# Patient Record
Sex: Female | Born: 1965 | Race: Black or African American | Hispanic: No | Marital: Married | Smoking: Never smoker
Health system: Southern US, Community
[De-identification: ages and names within clinical notes are randomized; demographics above are authoritative.]

## PROBLEM LIST (undated history)

## (undated) DIAGNOSIS — E7800 Pure hypercholesterolemia, unspecified: Secondary | ICD-10-CM

## (undated) DIAGNOSIS — I639 Cerebral infarction, unspecified: Secondary | ICD-10-CM

## (undated) HISTORY — PX: ABDOMINAL HYSTERECTOMY: SHX81

## (undated) HISTORY — PX: FRACTURE SURGERY: SHX138

## (undated) HISTORY — PX: BREAST SURGERY: SHX581

## (undated) HISTORY — PX: CHOLECYSTECTOMY: SHX55

---

## 2017-08-29 ENCOUNTER — Emergency Department (HOSPITAL_COMMUNITY)
Admission: EM | Admit: 2017-08-29 | Discharge: 2017-08-29 | Disposition: A | Discharge status: Home / Self Care | Payer: Self-pay | Attending: Emergency Medicine | Admitting: Emergency Medicine

## 2017-08-29 ENCOUNTER — Other Ambulatory Visit: Payer: Self-pay

## 2017-08-29 ENCOUNTER — Encounter (HOSPITAL_COMMUNITY): Payer: Self-pay | Admitting: *Deleted

## 2017-08-29 ENCOUNTER — Emergency Department (HOSPITAL_COMMUNITY): Payer: Self-pay

## 2017-08-29 DIAGNOSIS — R109 Unspecified abdominal pain: Secondary | ICD-10-CM | POA: Insufficient documentation

## 2017-08-29 DIAGNOSIS — N8302 Follicular cyst of left ovary: Secondary | ICD-10-CM | POA: Insufficient documentation

## 2017-08-29 DIAGNOSIS — N83209 Unspecified ovarian cyst, unspecified side: Secondary | ICD-10-CM

## 2017-08-29 DIAGNOSIS — Z8673 Personal history of transient ischemic attack (TIA), and cerebral infarction without residual deficits: Secondary | ICD-10-CM | POA: Insufficient documentation

## 2017-08-29 DIAGNOSIS — R3121 Asymptomatic microscopic hematuria: Secondary | ICD-10-CM | POA: Insufficient documentation

## 2017-08-29 DIAGNOSIS — Z79899 Other long term (current) drug therapy: Secondary | ICD-10-CM | POA: Insufficient documentation

## 2017-08-29 HISTORY — DX: Cerebral infarction, unspecified: I63.9

## 2017-08-29 HISTORY — DX: Pure hypercholesterolemia, unspecified: E78.00

## 2017-08-29 LAB — BASIC METABOLIC PANEL
Anion gap: 11 (ref 5–15)
BUN: 12 mg/dL (ref 6–20)
CALCIUM: 9.3 mg/dL (ref 8.9–10.3)
CHLORIDE: 104 mmol/L (ref 101–111)
CO2: 24 mmol/L (ref 22–32)
CREATININE: 0.84 mg/dL (ref 0.44–1.00)
GFR calc non Af Amer: >60 mL/min (ref >60)
GLUCOSE: 128 mg/dL — AB (ref 65–99)
Potassium: 3.9 mmol/L (ref 3.5–5.1)
Sodium: 139 mmol/L (ref 135–145)

## 2017-08-29 LAB — URINALYSIS, ROUTINE W REFLEX MICROSCOPIC
BILIRUBIN URINE: NEGATIVE
Bacteria, UA: NONE SEEN
GLUCOSE, UA: NEGATIVE mg/dL
Ketones, ur: 20 mg/dL — AB
NITRITE: NEGATIVE
PH: 5 (ref 5.0–8.0)
Protein, ur: NEGATIVE mg/dL
Specific Gravity, Urine: 1.03 (ref 1.005–1.030)

## 2017-08-29 LAB — CBC
HCT: 41.8 % (ref 36.0–46.0)
Hemoglobin: 13.6 g/dL (ref 12.0–15.0)
MCH: 27.4 pg (ref 26.0–34.0)
MCHC: 32.5 g/dL (ref 30.0–36.0)
MCV: 84.3 fL (ref 78.0–100.0)
Platelets: 284 10*3/uL (ref 150–400)
RBC: 4.96 MIL/uL (ref 3.87–5.11)
RDW: 13.2 % (ref 11.5–15.5)
WBC: 10.8 10*3/uL — ABNORMAL HIGH (ref 4.0–10.5)

## 2017-08-29 LAB — I-STAT BETA HCG BLOOD, ED (MC, WL, AP ONLY)

## 2017-08-29 MED ORDER — MORPHINE SULFATE (PF) 4 MG/ML IV SOLN
4.0000 mg | Freq: Once | INTRAVENOUS | Status: AC
  Administered 2017-08-29: 4 mg via INTRAVENOUS
  Filled 2017-08-29: qty 1

## 2017-08-29 MED ORDER — ONDANSETRON HCL 4 MG/2ML IJ SOLN
4.0000 mg | Freq: Once | INTRAMUSCULAR | Status: AC
  Administered 2017-08-29: 4 mg via INTRAVENOUS
  Filled 2017-08-29: qty 2

## 2017-08-29 MED ORDER — ONDANSETRON 4 MG PO TBDP: 4.0000 mg | ORAL_TABLET | Freq: Three times a day (TID) | ORAL | 0 refills | Status: AC | PRN

## 2017-08-29 MED ORDER — ONDANSETRON 4 MG PO TBDP
4.0000 mg | ORAL_TABLET | Freq: Once | ORAL | Status: AC | PRN
  Administered 2017-08-29: 4 mg via ORAL
  Filled 2017-08-29: qty 1

## 2017-08-29 NOTE — Discharge Instructions (Signed)
You were seen in the ER for left lower back pain.  Your CT scan showed 2 hemorrhagic cysts on your left side with some blood around them otherwise your CT looked normal.  Your urinalysis did have a small amount of blood but no signs of infection.. As we discussed it is possible that you may have passed a kidney stone although this is less likely.  Take  600 mg of ibuprofen and/or 500 to 1000 mg of acetaminophen every 6-8 hours for the next 2 to 3 days to help with the pain.  Zofran for nausea as needed.  Return to the ER for worsening pain, vomiting, urinary symptoms, bloody diarrhea, fevers, chills, groin numbness, weakness or numbness to her extremities.

## 2017-08-29 NOTE — ED Triage Notes (Signed)
Pt was in class around 9am this morning when she felt like she faint and got sweaty.  Pt then drove herself home and got her daughter and came to the ED.  Pt states she feels worse now.  No visible signs of sweating noted on pt.  Pt reports left lower side pain that radiates to her back.  Pt reports sitting up makes the pain worse.  Pt reports nausea but no vomiting. Pt a/o x 4 and normally ambulatory.

## 2017-08-29 NOTE — ED Provider Notes (Signed)
[facility]-EMERGENCY DEPT Provider Note   CSN: [id number] Arrival date & time: 08/29/17  1205  History   Chief Complaint Chief Complaint  Patient presents with  . Flank Pain   HPI Melissa Nestleamela Cline is a 52 y.o. female here for evaluation of left low back pain.  Pain is now an 8/10 after taking Tylenol 1 hour ago, at onset of pain was 10/10.  Pain was sudden.  Nonradiating.  Described as sharp and dull.  Associated with sweats and nausea.  Aggravated by sitting up.  She denies any fevers, vomiting, frontal abdominal pain, dysuria, hematuria, urgency, frequency, changes to her bowel movements, vaginal discharge, saddle anesthesia, numbness weakness to extremities. No trauma, falls. No previous h/o back injuries or surgeries. H/o hysterectomy. Not sexually active.   HPI  Past Medical History:  Diagnosis Date  . Hypercholesteremia   . Stroke [facility](HCC)     There are no active problems to display for this patient.   Past Surgical History:  Procedure Laterality Date  . ABDOMINAL HYSTERECTOMY    . BREAST SURGERY    . CHOLECYSTECTOMY    . FRACTURE SURGERY Right    ankle     OB History   None      Home Medications    Prior to Admission medications   Medication Sig Start Date End Date Taking? Authorizing Provider  acetaminophen (TYLENOL) 500 MG tablet Take 1,000 mg by mouth daily as needed (pain).   Yes [provider]  atorvastatin (LIPITOR) 80 MG tablet Take 80 mg by mouth at bedtime. 02/27/17 02/27/18 Yes [provider]  baclofen (LIORESAL) 10 MG tablet Take 10 mg by mouth 2 (two) times daily. 06/07/17  Yes [provider]  Fremanezumab-vfrm (AJOVY) 225 MG/1.5ML SOSY Inject 225 mg into the skin every 30 (thirty) days. 02/08/17 02/08/18 Yes [provider]  gabapentin (NEURONTIN) 300 MG capsule Take 900 mg by mouth at bedtime. 06/07/17  Yes [provider]  meloxicam (MOBIC) 15 MG tablet Take 15 mg by mouth daily.  04/10/17  Yes [provider]  norethindrone (AYGESTIN) 5 MG tablet Take 5 mg by mouth daily. 11/23/15  Yes [provider]  nortriptyline (PAMELOR) 25 MG capsule Take 25 mg by mouth daily. 06/06/17 06/06/18 Yes [provider]  ondansetron (ZOFRAN) 4 MG tablet Take 4 mg by mouth every 4 (four) hours as needed for nausea. 07/08/15  Yes [provider]  riboflavin (VITAMIN B-2) 100 MG TABS tablet Take 100 mg by mouth 2 (two) times daily.   Yes [provider]  Vitamin D, Ergocalciferol, (DRISDOL) 50000 units CAPS capsule TAKE ONE CAPSULE (50,000 UNITS TOTAL) BY MOUTH ONCE A WEEK. 03/15/17  Yes [provider]  ondansetron (ZOFRAN ODT) 4 MG disintegrating tablet Take 1 tablet (4 mg total) by mouth every 8 (eight) hours as needed for nausea or vomiting. 08/29/17   Liberty HandyGibbons, Quinnetta Roepke J, PA-C    Family History No family history on file.  Social History Social History   Tobacco Use  . Smoking status: Never Smoker  . Smokeless tobacco: Never Used  Substance Use Topics  . Alcohol use: Not Currently  . Drug use: Never     Allergies   Lexapro  [escitalopram]; Flagyl [metronidazole]; and Nsaids   Review of Systems Review of Systems  Constitutional: Positive for diaphoresis.  Gastrointestinal: Positive for nausea.  Musculoskeletal: Positive for back pain.  All other systems reviewed and are negative.    Physical Exam Updated Vital Signs BP  140/67   Pulse 85   Resp 16   Ht 5\' 4"  (1.626 m)   Wt 98.4 kg (217 lb)   SpO2 100%   BMI 37.25 kg/m   Physical Exam  Constitutional: She is oriented to person, place, and time. She appears well-developed and well-nourished. No distress.  Non toxic.  Skin is dry.  HENT:  Head: Normocephalic and atraumatic.  Nose: Nose normal.  Moist mucous membranes   Eyes: Pupils are equal, round, and reactive to light. Conjunctivae and EOM are normal.  Neck: Normal range of motion.  Cardiovascular: Normal  rate, regular rhythm and intact distal pulses.  2+ DP and radial pulses bilaterally. No LE edema.   Pulmonary/Chest: Effort normal and breath sounds normal.  Abdominal: Soft. Bowel sounds are normal. There is tenderness.   moderate suprapubic and left CVA tenderness.  No G/R/R. Negative Murphy's and McBurney's.  Soft.  Musculoskeletal: Normal range of motion.  Neurological: She is alert and oriented to person, place, and time.  Skin: Skin is warm and dry. Capillary refill takes less than 2 seconds.  Psychiatric: She has a normal mood and affect. Her behavior is normal.  Nursing note and vitals reviewed.    ED Treatments / Results  Labs (all labs ordered are listed, but only abnormal results are displayed) Labs Reviewed  URINALYSIS, ROUTINE W REFLEX MICROSCOPIC - Abnormal; Notable for the following components:      Result Value   APPearance TURBID (*)    Hgb urine dipstick MODERATE (*)    Ketones, ur 20 (*)    Leukocytes, UA TRACE (*)    All other components within normal limits  BASIC METABOLIC PANEL - Abnormal; Notable for the following components:   Glucose, Bld 128 (*)    All other components within normal limits  CBC - Abnormal; Notable for the following components:   WBC 10.8 (*)    All other components within normal limits  URINE CULTURE  I-STAT BETA HCG BLOOD, ED ([facility], [facility], [facility] ONLY)    EKG EKG Interpretation  Date/Time:  Wednesday August 29 2017 12:27:31 EDT Ventricular Rate:  73 PR Interval:    QRS Duration: 78 QT Interval:  411 QTC Calculation: 453 R Axis:   54 Text Interpretation:  Sinus rhythm no acute ST/T changes No old tracing to compare Confirmed by Pricilla Loveless [id number]) on 08/29/2017 1:12:33 PM   Radiology Ct Renal Stone Study  Result Date: 08/29/2017 CLINICAL DATA:  Left flank pain.  Hematuria. EXAM: CT ABDOMEN AND PELVIS WITHOUT CONTRAST TECHNIQUE: Multidetector CT imaging of the abdomen and pelvis was performed following the standard protocol without  IV contrast. COMPARISON:  None. FINDINGS: Lower chest: No acute abnormality. Mild subsegmental atelectasis at the left lung base. Hepatobiliary: No focal liver abnormality is seen. Status post cholecystectomy. No biliary dilatation. Pancreas: Unremarkable. No pancreatic ductal dilatation or surrounding inflammatory changes. Spleen: Normal in size without focal abnormality. Adrenals/Urinary Tract: Adrenal glands are unremarkable. Kidneys are normal, without renal calculi, focal lesion, or hydronephrosis. Bladder is unremarkable. Stomach/Bowel: Stomach is within normal limits. Appendix appears normal. No evidence of bowel wall thickening, distention, or inflammatory changes. Vascular/Lymphatic: No significant vascular findings are present. No enlarged abdominal or pelvic lymph nodes. Reproductive: Two cystic lesions within the left ovary measuring up to 4.6 cm. The smaller lesion is slightly irregular in appearance. The right ovary is unremarkable. Status post hysterectomy. Other: Small amount of intermediate density fluid in the pelvis. No pneumoperitoneum. Musculoskeletal: No acute or significant osseous findings. IMPRESSION:  1. Two cystic lesions within the left ovary measuring up to 4.6 cm. The smaller lesion is slightly irregular in appearance, and given the small amount of intermediate density fluid in the pelvis, findings likely reflect a ruptured hemorrhagic ovarian cyst. Follow-up pelvic ultrasound in 6-12 weeks is recommended to evaluate for resolution. This recommendation follows ACR consensus guidelines: White Paper of the ACR Incidental Findings Committee II on Adnexal Findings. J Am Coll Radiol [phone]. Electronically Signed   By: Obie Dredge M.D.   On: 08/29/2017 14:30    Procedures Procedures (including critical care time)  Medications Ordered in ED Medications  ondansetron (ZOFRAN-ODT) disintegrating tablet 4 mg (4 mg Oral Given 08/29/17 1236)  morphine 4 MG/ML injection 4 mg (4 mg  Intravenous Given 08/29/17 1343)  ondansetron (ZOFRAN) injection 4 mg (4 mg Intravenous Given 08/29/17 1343)     Initial Impression / Assessment and Plan / ED Course  I have reviewed the triage vital signs and the nursing notes.  Pertinent labs & imaging results that were available during my care of the patient were reviewed by me and considered in my medical decision making (see chart for details).  Clinical Course as of Aug 30 1522  Wed Aug 29, 2017  1503 IMPRESSION: 1. Two cystic lesions within the left ovary measuring up to 4.6 cm. The smaller lesion is slightly irregular in appearance, and given the small amount of intermediate density fluid in the pelvis, findings likely reflect a ruptured hemorrhagic ovarian cyst. Follow-up pelvic ultrasound in 6-12 weeks is recommended to evaluate for resolution. This recommendation follows ACR consensus guidelines: White Paper of the ACR Incidental Findings Committee II on Adnexal Findings. J Am Coll Radiol [phone].    CT RENAL STONE STUDY [CG]  1512 Reevaluated patient.  Discussed CT results.  Pain has improved.  Ready for discharge.   [CG]    Clinical Course User Index [CG] Liberty Handy, PA-C   Differential diagnosis includes UTI/pyelonephritis, kidney or ureteral stone.  Also considering MSK injury although she denies recent strenuous activity or trauma.  Less likely PID or torsion as reported pain is left lower back.   On exam she is well-appearing.  Suprapubic and left CVA tenderness.  No peritonitis.  No reproducible TL midline or paraspinal tenderness.  Negative SLR bilaterally.  Lower extremities are neurovascularly intact.  She has no signs or symptoms of cauda equina.  I do not think vaginal exam indicated as she is s/p hysterectomy, not sexually active and denies urinary or vaginal symptoms. No LLQ or RLQ tenderness.   We will obtain screening labs, urinalysis.  Anticipate CT renal study.  Final Clinical  Impressions(s) / ED Diagnoses   1520: CT shows two hemorrhagic cysts on L otherwise unremarkable. UA with moderate hgb, mild ketonuria, trace leukocytes but no bacteria and 11-20 squamous epi otherwise no convincing signs of UTI. She has not noticed gross vaginal bleed or hematuria. She may have passed kidney stone, but less likely. Will dc with zofran, NSAIDs for suspected hemorrhagic cyst associated pain.   Old records, if available, reviewed by me. Imaging and labs viewed and interpreted by me and used in the medical decision making (formal interpretation from radiologist). Discharge home in stable condition, return precautions discussed. Patient, family agreeable with plan for discharge home.  Final diagnoses:  Hemorrhagic ovarian cyst  Asymptomatic microscopic hematuria  Left flank pain    ED Discharge Orders        Ordered    ondansetron (ZOFRAN ODT) 4  MG disintegrating tablet  Every 8 hours PRN     08/29/17 1513       Liberty Handy, PA-C 08/29/17 1524    Pricilla Loveless, MD 08/30/17 [phone]

## 2017-08-30 LAB — URINE CULTURE: CULTURE: NO GROWTH

## 2019-09-06 IMAGING — CT CT RENAL STONE PROTOCOL
2 of 4 series · 17 of 46 positions shown, 19 images · non-contrast
Comparison: None.

CLINICAL DATA: Left flank pain.  Hematuria.

EXAM:
CT ABDOMEN AND PELVIS WITHOUT CONTRAST
TECHNIQUE: Multidetector CT imaging of the abdomen and pelvis was performed
following the standard protocol without IV contrast.

[Series 2: axial st · axial · 0.71mm/px · z∈[+1348,+1743]mm · 14 of 89 slices shown, 16 images]
[im 5/89  soft-tissue]
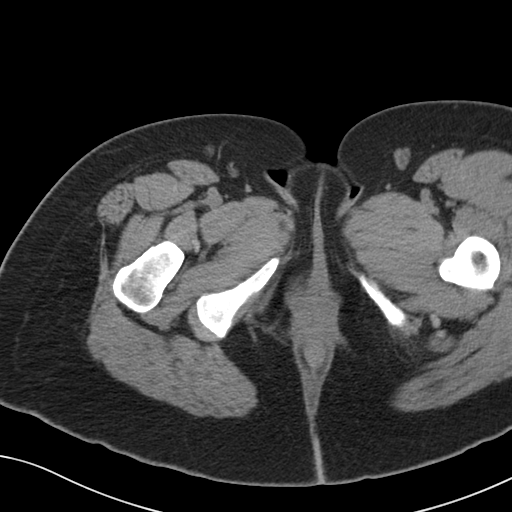
[im 5/89  bone]
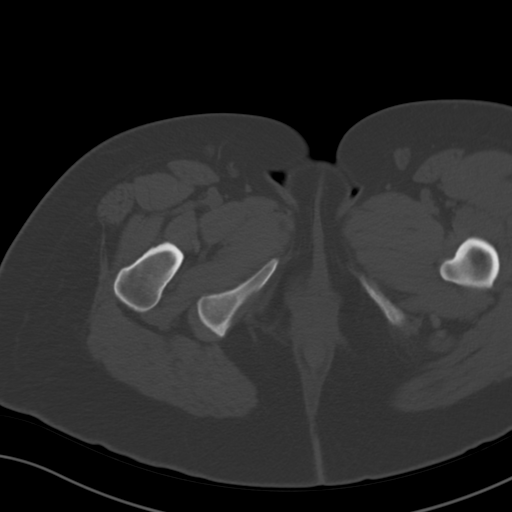
[im 10/89  soft-tissue]
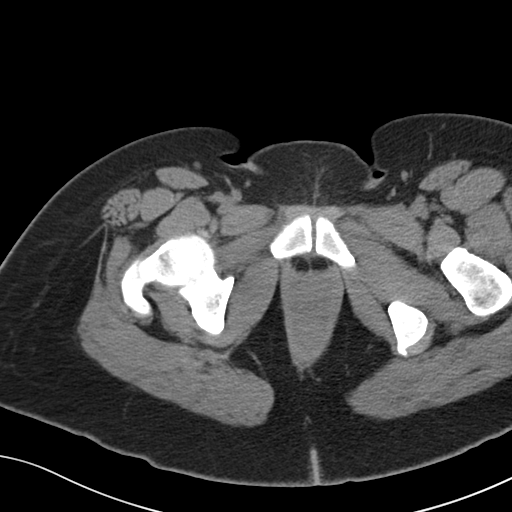
[im 19/89  soft-tissue]
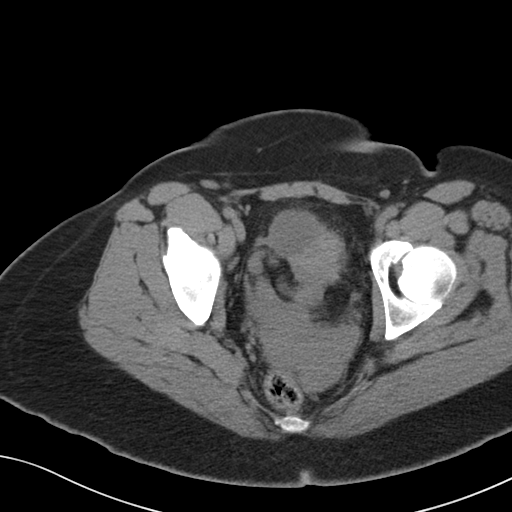
[im 24/89  soft-tissue]
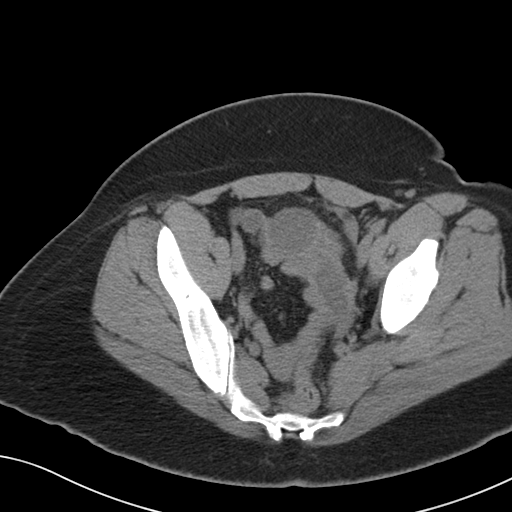
[im 28/89  soft-tissue]
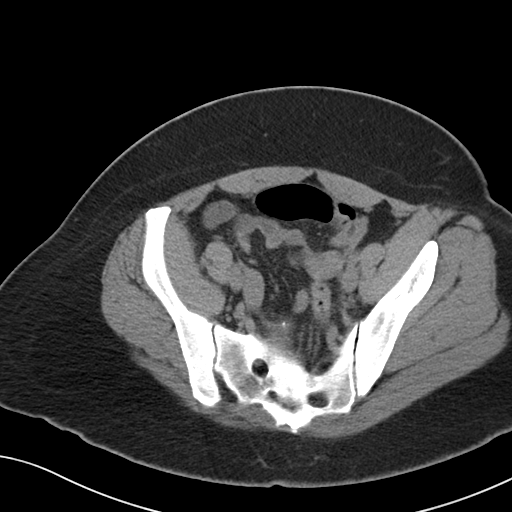
[im 38/89  soft-tissue]
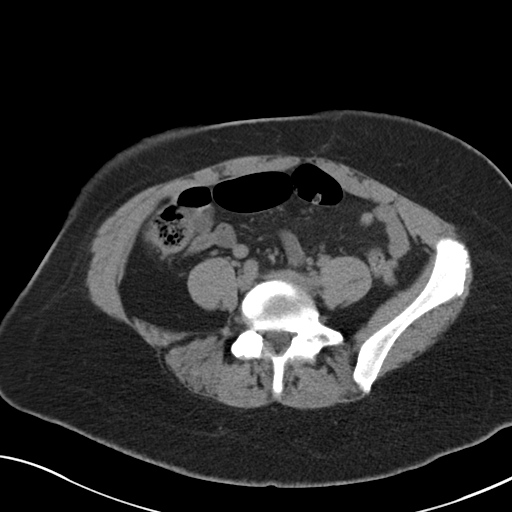
[im 42/89  soft-tissue]
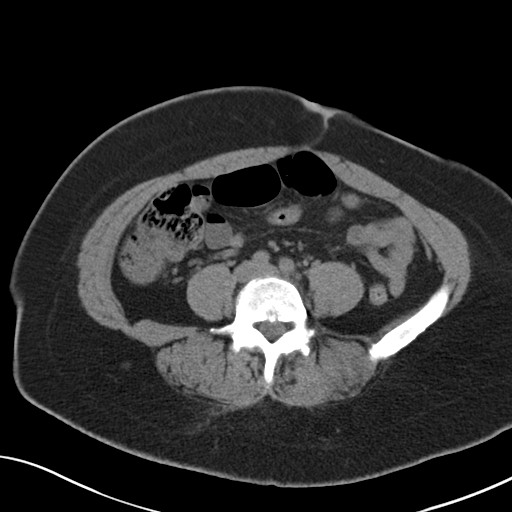
[im 47/89  soft-tissue]
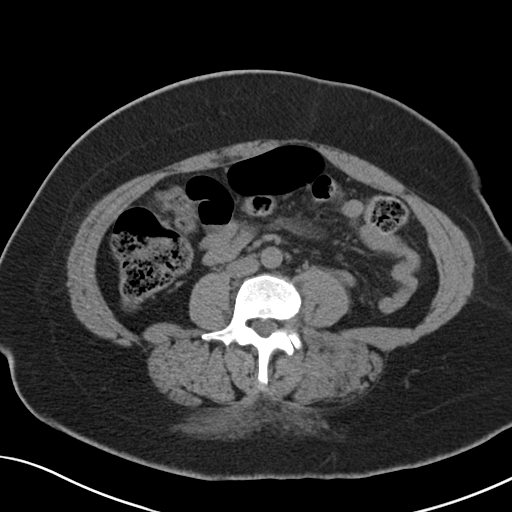
[im 51/89  soft-tissue]
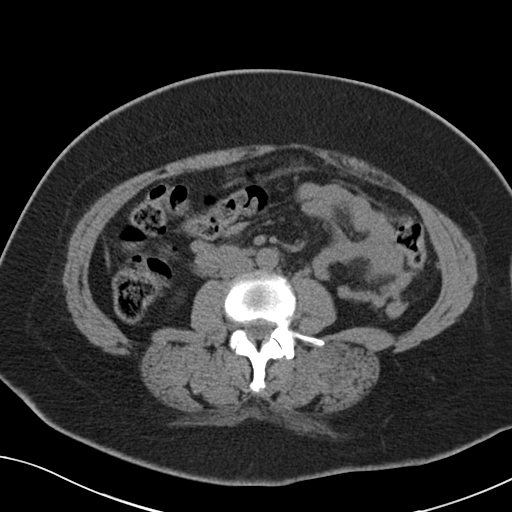
[im 51/89  bone]
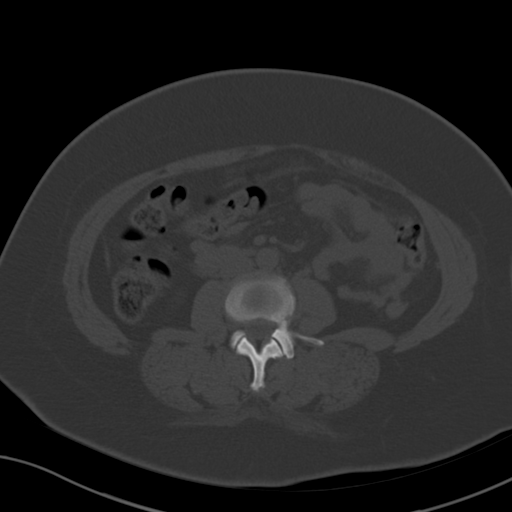
[im 61/89  soft-tissue]
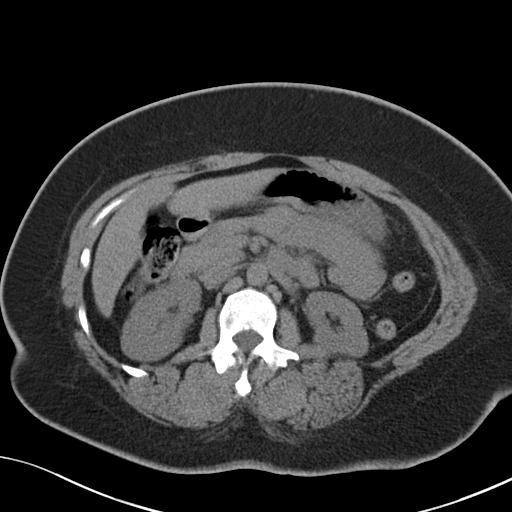
[im 65/89  soft-tissue]
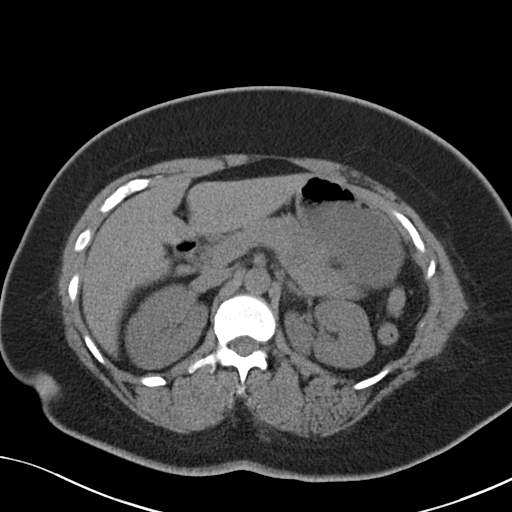
[im 70/89  soft-tissue]
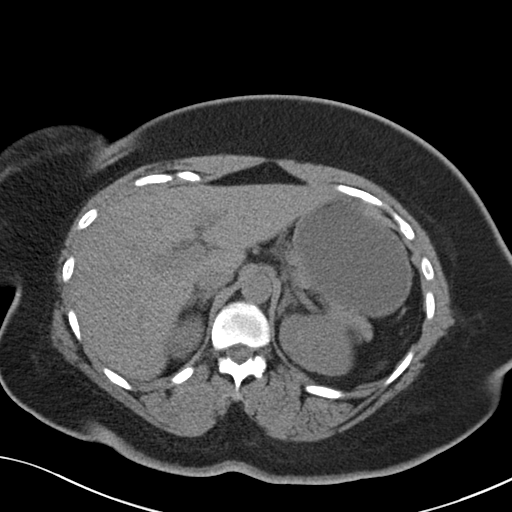
[im 79/89  soft-tissue]
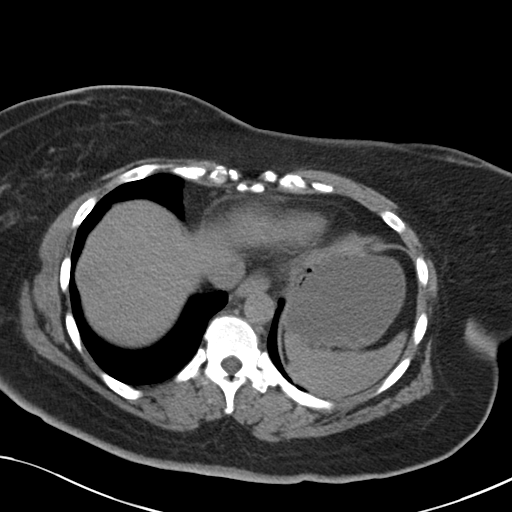
[im 84/89  soft-tissue]
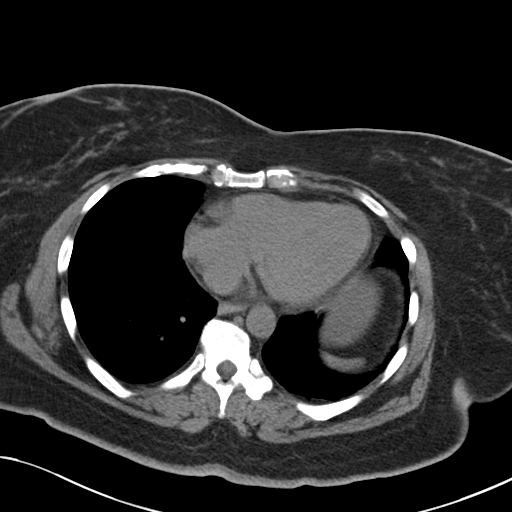

[Series 4: coronal · coronal · 0.92mm/px · 3 of 117 slices shown]
[im 39/117  soft-tissue]
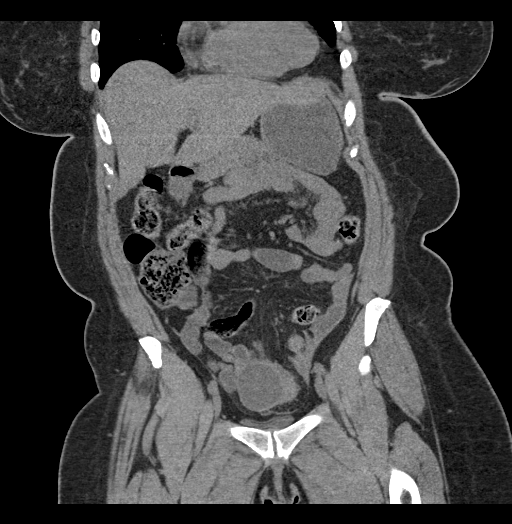
[im 52/117  soft-tissue]
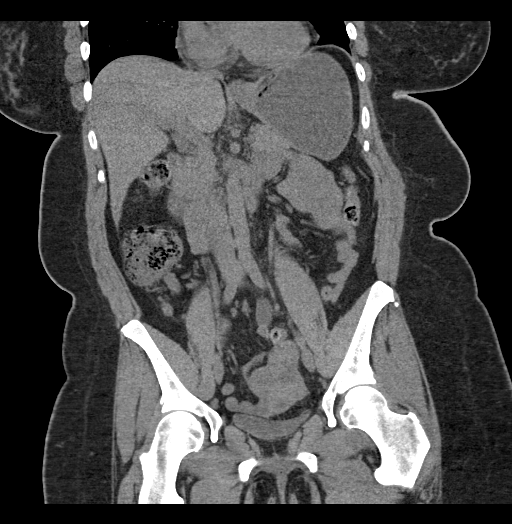
[im 65/117  soft-tissue]
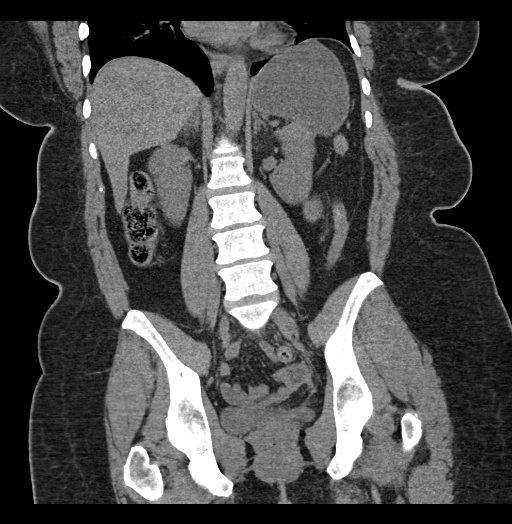

[17 of 46 positions shown; findings below may reference images not displayed]

FINDINGS: Lower chest: No acute abnormality. Mild subsegmental atelectasis at
the left lung base.

Hepatobiliary: No focal liver abnormality is seen. Status post
cholecystectomy. No biliary dilatation.

Pancreas: Unremarkable. No pancreatic ductal dilatation or
surrounding inflammatory changes.

Spleen: Normal in size without focal abnormality.

Adrenals/Urinary Tract: Adrenal glands are unremarkable. Kidneys are
normal, without renal calculi, focal lesion, or hydronephrosis.
Bladder is unremarkable.

Stomach/Bowel: Stomach is within normal limits. Appendix appears
normal. No evidence of bowel wall thickening, distention, or
inflammatory changes.

Vascular/Lymphatic: No significant vascular findings are present. No
enlarged abdominal or pelvic lymph nodes.

Reproductive: Two cystic lesions within the left ovary measuring up
to 4.6 cm. The smaller lesion is slightly irregular in appearance.
The right ovary is unremarkable. Status post hysterectomy.

Other: Small amount of intermediate density fluid in the pelvis. No
pneumoperitoneum.

Musculoskeletal: No acute or significant osseous findings.
IMPRESSION: 1. Two cystic lesions within the left ovary measuring up to 4.6 cm.
The smaller lesion is slightly irregular in appearance, and given
the small amount of intermediate density fluid in the pelvis,
findings likely reflect a ruptured hemorrhagic ovarian cyst.
Follow-up pelvic ultrasound in 6-12 weeks is recommended to evaluate
for resolution. This recommendation follows ACR consensus
guidelines: White Paper of the ACR Incidental Findings Committee II
on Adnexal Findings. [HOSPITAL] [DATE].
